# Patient Record
Sex: Female | Born: 1970 | Race: White | Hispanic: No | Marital: Married | Smoking: Never smoker
Health system: Southern US, Community
[De-identification: ages and names within clinical notes are randomized; demographics above are authoritative.]

## PROBLEM LIST (undated history)

## (undated) HISTORY — PX: AUGMENTATION MAMMAPLASTY: SUR837

---

## 1999-11-24 ENCOUNTER — Other Ambulatory Visit: Admission: RE | Admit: 1999-11-24 | Discharge: 1999-11-24 | Payer: Self-pay | Admitting: Family Medicine

## 2001-03-27 ENCOUNTER — Other Ambulatory Visit: Admission: RE | Admit: 2001-03-27 | Discharge: 2001-03-27 | Payer: Self-pay | Admitting: Obstetrics and Gynecology

## 2002-04-25 ENCOUNTER — Other Ambulatory Visit: Admission: RE | Admit: 2002-04-25 | Discharge: 2002-04-25 | Payer: Self-pay | Admitting: Obstetrics and Gynecology

## 2003-05-15 ENCOUNTER — Other Ambulatory Visit: Admission: RE | Admit: 2003-05-15 | Discharge: 2003-05-15 | Payer: Self-pay | Admitting: Obstetrics and Gynecology

## 2004-06-09 ENCOUNTER — Other Ambulatory Visit: Admission: RE | Admit: 2004-06-09 | Discharge: 2004-06-09 | Payer: Self-pay | Admitting: Obstetrics and Gynecology

## 2004-10-16 ENCOUNTER — Inpatient Hospital Stay (HOSPITAL_COMMUNITY): Admission: RE | Admit: 2004-10-16 | Discharge: 2004-10-20 | Payer: Self-pay | Admitting: Obstetrics and Gynecology

## 2004-10-21 ENCOUNTER — Encounter: Admission: RE | Admit: 2004-10-21 | Discharge: 2004-11-12 | Payer: Self-pay | Admitting: Obstetrics and Gynecology

## 2004-11-24 ENCOUNTER — Other Ambulatory Visit: Admission: RE | Admit: 2004-11-24 | Discharge: 2004-11-24 | Payer: Self-pay | Admitting: Obstetrics and Gynecology

## 2006-01-04 ENCOUNTER — Other Ambulatory Visit: Admission: RE | Admit: 2006-01-04 | Discharge: 2006-01-04 | Payer: Self-pay | Admitting: Obstetrics and Gynecology

## 2013-11-20 ENCOUNTER — Other Ambulatory Visit: Payer: Self-pay | Admitting: Obstetrics and Gynecology

## 2014-06-28 ENCOUNTER — Other Ambulatory Visit: Payer: Self-pay | Admitting: Obstetrics and Gynecology

## 2014-07-01 LAB — CYTOLOGY - PAP

## 2019-05-22 ENCOUNTER — Other Ambulatory Visit: Payer: Self-pay | Admitting: Obstetrics and Gynecology

## 2019-05-22 DIAGNOSIS — R928 Other abnormal and inconclusive findings on diagnostic imaging of breast: Secondary | ICD-10-CM

## 2019-05-25 ENCOUNTER — Other Ambulatory Visit: Payer: Self-pay

## 2019-05-25 ENCOUNTER — Ambulatory Visit
Admission: RE | Admit: 2019-05-25 | Discharge: 2019-05-25 | Disposition: A | Discharge status: Home / Self Care | Payer: BC Managed Care – PPO | Source: Ambulatory Visit | Attending: Obstetrics and Gynecology | Admitting: Obstetrics and Gynecology

## 2019-05-25 ENCOUNTER — Other Ambulatory Visit: Payer: Self-pay | Admitting: Obstetrics and Gynecology

## 2019-05-25 DIAGNOSIS — R928 Other abnormal and inconclusive findings on diagnostic imaging of breast: Secondary | ICD-10-CM

## 2019-07-03 ENCOUNTER — Other Ambulatory Visit: Payer: Self-pay

## 2019-07-03 DIAGNOSIS — Z20822 Contact with and (suspected) exposure to covid-19: Secondary | ICD-10-CM

## 2019-07-04 LAB — NOVEL CORONAVIRUS, NAA: SARS-CoV-2, NAA: NOT DETECTED

## 2019-11-23 ENCOUNTER — Ambulatory Visit
Admission: RE | Admit: 2019-11-23 | Discharge: 2019-11-23 | Disposition: A | Discharge status: Home / Self Care | Payer: BC Managed Care – PPO | Source: Ambulatory Visit | Attending: Obstetrics and Gynecology | Admitting: Obstetrics and Gynecology

## 2019-11-23 ENCOUNTER — Other Ambulatory Visit: Payer: Self-pay | Admitting: Obstetrics and Gynecology

## 2019-11-23 ENCOUNTER — Other Ambulatory Visit: Payer: Self-pay

## 2019-11-23 DIAGNOSIS — R921 Mammographic calcification found on diagnostic imaging of breast: Secondary | ICD-10-CM

## 2019-11-23 DIAGNOSIS — R928 Other abnormal and inconclusive findings on diagnostic imaging of breast: Secondary | ICD-10-CM

## 2020-05-30 ENCOUNTER — Ambulatory Visit
Admission: RE | Admit: 2020-05-30 | Discharge: 2020-05-30 | Disposition: A | Discharge status: Home / Self Care | Payer: BC Managed Care – PPO | Source: Ambulatory Visit | Attending: Obstetrics and Gynecology | Admitting: Obstetrics and Gynecology

## 2020-05-30 ENCOUNTER — Other Ambulatory Visit: Payer: Self-pay

## 2020-05-30 DIAGNOSIS — R921 Mammographic calcification found on diagnostic imaging of breast: Secondary | ICD-10-CM

## 2021-06-26 ENCOUNTER — Other Ambulatory Visit: Payer: Self-pay | Admitting: Obstetrics and Gynecology

## 2021-06-30 ENCOUNTER — Other Ambulatory Visit: Payer: Self-pay | Admitting: Obstetrics and Gynecology

## 2021-06-30 DIAGNOSIS — Z09 Encounter for follow-up examination after completed treatment for conditions other than malignant neoplasm: Secondary | ICD-10-CM

## 2021-06-30 DIAGNOSIS — R921 Mammographic calcification found on diagnostic imaging of breast: Secondary | ICD-10-CM

## 2021-07-23 ENCOUNTER — Ambulatory Visit
Admission: RE | Admit: 2021-07-23 | Discharge: 2021-07-23 | Disposition: A | Discharge status: Home / Self Care | Payer: BC Managed Care – PPO | Source: Ambulatory Visit | Attending: Obstetrics and Gynecology | Admitting: Obstetrics and Gynecology

## 2021-07-23 DIAGNOSIS — R921 Mammographic calcification found on diagnostic imaging of breast: Secondary | ICD-10-CM

## 2021-07-23 DIAGNOSIS — Z09 Encounter for follow-up examination after completed treatment for conditions other than malignant neoplasm: Secondary | ICD-10-CM

## 2022-03-23 ENCOUNTER — Emergency Department (HOSPITAL_COMMUNITY)
Admission: EM | Admit: 2022-03-23 | Discharge: 2022-03-23 | Discharge status: Against Medical Advice | Payer: BC Managed Care – PPO | Attending: Emergency Medicine | Admitting: Emergency Medicine

## 2022-03-23 ENCOUNTER — Other Ambulatory Visit: Payer: Self-pay

## 2022-03-23 ENCOUNTER — Encounter (HOSPITAL_COMMUNITY): Payer: Self-pay

## 2022-03-23 DIAGNOSIS — Z5321 Procedure and treatment not carried out due to patient leaving prior to being seen by health care provider: Secondary | ICD-10-CM | POA: Diagnosis not present

## 2022-03-23 DIAGNOSIS — R109 Unspecified abdominal pain: Secondary | ICD-10-CM | POA: Diagnosis not present

## 2022-03-23 DIAGNOSIS — R110 Nausea: Secondary | ICD-10-CM | POA: Insufficient documentation

## 2022-03-23 NOTE — ED Provider Triage Note (Signed)
Emergency Medicine Provider Triage Evaluation Note  Charlotte Morris , a 51 y.o. female  was evaluated in triage.  Pt complains of abd pain. Report pain below her umbilicus a few hrs ago.  Felt nauseous, tried to vomit without relief.  Initially thought she may have food poisoning, but now felt worse. No fever, cp sob, dysuria  Review of Systems  Positive: As above Negative: As above  Physical Exam  BP (!) 138/55 (BP Location: Right Arm)   Pulse (!) 57   Temp 97.8 F (36.6 C) (Oral)   Resp 17   Ht 5' 5.5" (1.664 m)   Wt 81.6 kg   LMP 05/14/2019   SpO2 97%   BMI 29.50 kg/m  Gen:   Awake, no distress   Resp:  Normal effort  MSK:   Moves extremities without difficulty  Other:    Medical Decision Making  Medically screening exam initiated at 9:05 PM.  Appropriate orders placed.  Charlotte Morris was informed that the remainder of the evaluation will be completed by another provider, this initial triage assessment does not replace that evaluation, and the importance of remaining in the ED until their evaluation is complete.     Fayrene Helper, PA-C 03/23/22 2106

## 2022-03-23 NOTE — ED Triage Notes (Signed)
Patient ate some expired meat. Made herself vomit. Having pain below her belly button. No diarrhea. Thinks it could be food poisoning.

## 2022-03-24 ENCOUNTER — Observation Stay (HOSPITAL_BASED_OUTPATIENT_CLINIC_OR_DEPARTMENT_OTHER)
Admission: EM | Admit: 2022-03-24 | Discharge: 2022-03-25 | Disposition: A | Discharge status: Home / Self Care | Payer: BC Managed Care – PPO | Attending: Surgery | Admitting: Surgery

## 2022-03-24 ENCOUNTER — Inpatient Hospital Stay (HOSPITAL_COMMUNITY): Payer: BC Managed Care – PPO | Admitting: Anesthesiology

## 2022-03-24 ENCOUNTER — Other Ambulatory Visit: Payer: Self-pay

## 2022-03-24 ENCOUNTER — Emergency Department (HOSPITAL_BASED_OUTPATIENT_CLINIC_OR_DEPARTMENT_OTHER): Payer: BC Managed Care – PPO

## 2022-03-24 ENCOUNTER — Encounter (HOSPITAL_COMMUNITY): Admission: EM | Disposition: A | Discharge status: Home / Self Care | Payer: Self-pay | Source: Home / Self Care

## 2022-03-24 ENCOUNTER — Encounter (HOSPITAL_BASED_OUTPATIENT_CLINIC_OR_DEPARTMENT_OTHER): Payer: Self-pay

## 2022-03-24 DIAGNOSIS — R109 Unspecified abdominal pain: Secondary | ICD-10-CM | POA: Diagnosis present

## 2022-03-24 DIAGNOSIS — K353 Acute appendicitis with localized peritonitis, without perforation or gangrene: Principal | ICD-10-CM

## 2022-03-24 DIAGNOSIS — K3531 Acute appendicitis with localized peritonitis and gangrene, without perforation: Principal | ICD-10-CM | POA: Insufficient documentation

## 2022-03-24 DIAGNOSIS — K3580 Unspecified acute appendicitis: Secondary | ICD-10-CM | POA: Diagnosis present

## 2022-03-24 HISTORY — PX: LAPAROSCOPIC APPENDECTOMY: SHX408

## 2022-03-24 LAB — CBC
HCT: 42.3 % (ref 36.0–46.0)
Hemoglobin: 14.5 g/dL (ref 12.0–15.0)
MCH: 30.8 pg (ref 26.0–34.0)
MCHC: 34.3 g/dL (ref 30.0–36.0)
MCV: 89.8 fL (ref 80.0–100.0)
Platelets: 275 10*3/uL (ref 150–400)
RBC: 4.71 MIL/uL (ref 3.87–5.11)
RDW: 12.8 % (ref 11.5–15.5)
WBC: 13.2 10*3/uL — ABNORMAL HIGH (ref 4.0–10.5)
nRBC: 0 % (ref 0.0–0.2)

## 2022-03-24 LAB — URINALYSIS, ROUTINE W REFLEX MICROSCOPIC
Bilirubin Urine: NEGATIVE
Glucose, UA: NEGATIVE mg/dL
Hgb urine dipstick: NEGATIVE
Ketones, ur: 15 mg/dL — AB
Nitrite: NEGATIVE
Specific Gravity, Urine: 1.034 — ABNORMAL HIGH (ref 1.005–1.030)
pH: 5.5 (ref 5.0–8.0)

## 2022-03-24 LAB — COMPREHENSIVE METABOLIC PANEL
ALT: 15 U/L (ref 0–44)
AST: 15 U/L (ref 15–41)
Albumin: 4.8 g/dL (ref 3.5–5.0)
Alkaline Phosphatase: 40 U/L (ref 38–126)
Anion gap: 12 (ref 5–15)
BUN: 15 mg/dL (ref 6–20)
CO2: 22 mmol/L (ref 22–32)
Calcium: 9.8 mg/dL (ref 8.9–10.3)
Chloride: 104 mmol/L (ref 98–111)
Creatinine, Ser: 0.76 mg/dL (ref 0.44–1.00)
GFR, Estimated: >60 mL/min (ref >60)
Glucose, Bld: 116 mg/dL — ABNORMAL HIGH (ref 70–99)
Potassium: 3.6 mmol/L (ref 3.5–5.1)
Sodium: 138 mmol/L (ref 135–145)
Total Bilirubin: 2 mg/dL — ABNORMAL HIGH (ref 0.3–1.2)
Total Protein: 7.6 g/dL (ref 6.5–8.1)

## 2022-03-24 LAB — LIPASE, BLOOD: Lipase: 31 U/L (ref 11–51)

## 2022-03-24 LAB — PREGNANCY, URINE: Preg Test, Ur: NEGATIVE

## 2022-03-24 SURGERY — APPENDECTOMY, LAPAROSCOPIC
Anesthesia: General | Site: Abdomen

## 2022-03-24 MED ORDER — MORPHINE SULFATE (PF) 4 MG/ML IV SOLN
6.0000 mg | Freq: Once | INTRAVENOUS | Status: AC
  Administered 2022-03-24: 6 mg via INTRAVENOUS
  Filled 2022-03-24: qty 2

## 2022-03-24 MED ORDER — DOCUSATE SODIUM 100 MG PO CAPS
200.0000 mg | ORAL_CAPSULE | Freq: Two times a day (BID) | ORAL | Status: DC
  Administered 2022-03-24 – 2022-03-25 (×2): 200 mg via ORAL
  Filled 2022-03-24 (×2): qty 2

## 2022-03-24 MED ORDER — 0.9 % SODIUM CHLORIDE (POUR BTL) OPTIME
TOPICAL | Status: DC | PRN
  Administered 2022-03-24: 1000 mL

## 2022-03-24 MED ORDER — METRONIDAZOLE 500 MG/100ML IV SOLN
500.0000 mg | Freq: Once | INTRAVENOUS | Status: AC
  Administered 2022-03-24: 500 mg via INTRAVENOUS
  Filled 2022-03-24: qty 100

## 2022-03-24 MED ORDER — LIDOCAINE HCL (CARDIAC) PF 100 MG/5ML IV SOSY
PREFILLED_SYRINGE | INTRAVENOUS | Status: DC | PRN
  Administered 2022-03-24: 60 mg via INTRAVENOUS

## 2022-03-24 MED ORDER — ONDANSETRON HCL 4 MG/2ML IJ SOLN
4.0000 mg | Freq: Four times a day (QID) | INTRAMUSCULAR | Status: DC | PRN
  Administered 2022-03-24: 4 mg via INTRAVENOUS
  Filled 2022-03-24: qty 2

## 2022-03-24 MED ORDER — LACTATED RINGERS IV SOLN
INTRAVENOUS | Status: DC
  Administered 2022-03-25: 900 mL via INTRAVENOUS

## 2022-03-24 MED ORDER — ROCURONIUM BROMIDE 100 MG/10ML IV SOLN
INTRAVENOUS | Status: DC | PRN
  Administered 2022-03-24: 50 mg via INTRAVENOUS

## 2022-03-24 MED ORDER — LIDOCAINE 2% (20 MG/ML) 5 ML SYRINGE
INTRAMUSCULAR | Status: AC
  Filled 2022-03-24: qty 10

## 2022-03-24 MED ORDER — TRAMADOL HCL 50 MG PO TABS
50.0000 mg | ORAL_TABLET | Freq: Four times a day (QID) | ORAL | Status: DC | PRN
  Administered 2022-03-25: 50 mg via ORAL
  Filled 2022-03-24: qty 1

## 2022-03-24 MED ORDER — ONDANSETRON HCL 4 MG/2ML IJ SOLN
INTRAMUSCULAR | Status: AC
  Filled 2022-03-24: qty 2

## 2022-03-24 MED ORDER — MIDAZOLAM HCL 2 MG/2ML IJ SOLN
INTRAMUSCULAR | Status: AC
  Filled 2022-03-24: qty 2

## 2022-03-24 MED ORDER — SODIUM CHLORIDE 0.9 % IR SOLN
Status: DC | PRN
  Administered 2022-03-24: 1000 mL

## 2022-03-24 MED ORDER — DIPHENHYDRAMINE HCL 12.5 MG/5ML PO ELIX: 12.5000 mg | ORAL_SOLUTION | Freq: Four times a day (QID) | ORAL | Status: DC | PRN

## 2022-03-24 MED ORDER — DEXAMETHASONE SODIUM PHOSPHATE 10 MG/ML IJ SOLN
INTRAMUSCULAR | Status: DC | PRN
  Administered 2022-03-24: 10 mg via INTRAVENOUS

## 2022-03-24 MED ORDER — ACETAMINOPHEN 10 MG/ML IV SOLN
INTRAVENOUS | Status: AC
  Filled 2022-03-24: qty 100

## 2022-03-24 MED ORDER — OXYCODONE HCL 5 MG/5ML PO SOLN: 5.0000 mg | Freq: Once | ORAL | Status: DC | PRN

## 2022-03-24 MED ORDER — SIMETHICONE 80 MG PO CHEW: 40.0000 mg | CHEWABLE_TABLET | Freq: Four times a day (QID) | ORAL | Status: DC | PRN

## 2022-03-24 MED ORDER — IOHEXOL 300 MG/ML  SOLN
100.0000 mL | Freq: Once | INTRAMUSCULAR | Status: AC | PRN
  Administered 2022-03-24: 80 mL via INTRAVENOUS

## 2022-03-24 MED ORDER — CHLORHEXIDINE GLUCONATE 0.12 % MT SOLN
OROMUCOSAL | Status: AC
  Filled 2022-03-24: qty 15

## 2022-03-24 MED ORDER — ONDANSETRON 4 MG PO TBDP: 4.0000 mg | ORAL_TABLET | Freq: Four times a day (QID) | ORAL | Status: DC | PRN

## 2022-03-24 MED ORDER — ACETAMINOPHEN 10 MG/ML IV SOLN
INTRAVENOUS | Status: DC | PRN
  Administered 2022-03-24: 1000 mg via INTRAVENOUS

## 2022-03-24 MED ORDER — SUGAMMADEX SODIUM 200 MG/2ML IV SOLN
INTRAVENOUS | Status: DC | PRN
  Administered 2022-03-24: 200 mg via INTRAVENOUS

## 2022-03-24 MED ORDER — ROCURONIUM BROMIDE 10 MG/ML (PF) SYRINGE
PREFILLED_SYRINGE | INTRAVENOUS | Status: AC
  Filled 2022-03-24: qty 30

## 2022-03-24 MED ORDER — ACETAMINOPHEN 500 MG PO TABS
1000.0000 mg | ORAL_TABLET | Freq: Four times a day (QID) | ORAL | Status: DC
  Administered 2022-03-25: 1000 mg via ORAL
  Filled 2022-03-24: qty 2

## 2022-03-24 MED ORDER — SODIUM CHLORIDE 0.9 % IV SOLN
2.0000 g | Freq: Once | INTRAVENOUS | Status: AC
  Administered 2022-03-24: 2 g via INTRAVENOUS
  Filled 2022-03-24: qty 20

## 2022-03-24 MED ORDER — ONDANSETRON HCL 4 MG/2ML IJ SOLN: 4.0000 mg | Freq: Four times a day (QID) | INTRAMUSCULAR | Status: DC | PRN

## 2022-03-24 MED ORDER — OXYCODONE HCL 5 MG PO TABS: 5.0000 mg | ORAL_TABLET | Freq: Once | ORAL | Status: DC | PRN

## 2022-03-24 MED ORDER — FENTANYL CITRATE (PF) 250 MCG/5ML IJ SOLN
INTRAMUSCULAR | Status: AC
  Filled 2022-03-24: qty 5

## 2022-03-24 MED ORDER — ENOXAPARIN SODIUM 30 MG/0.3ML IJ SOSY
30.0000 mg | PREFILLED_SYRINGE | Freq: Two times a day (BID) | INTRAMUSCULAR | Status: DC
  Administered 2022-03-25: 30 mg via SUBCUTANEOUS
  Filled 2022-03-24: qty 0.3

## 2022-03-24 MED ORDER — HYDRALAZINE HCL 20 MG/ML IJ SOLN: 10.0000 mg | INTRAMUSCULAR | Status: DC | PRN

## 2022-03-24 MED ORDER — TRAMADOL HCL 50 MG PO TABS: 50.0000 mg | ORAL_TABLET | Freq: Four times a day (QID) | ORAL | Status: DC | PRN

## 2022-03-24 MED ORDER — FENTANYL CITRATE (PF) 100 MCG/2ML IJ SOLN
INTRAMUSCULAR | Status: DC | PRN
  Administered 2022-03-24 (×2): 50 ug via INTRAVENOUS

## 2022-03-24 MED ORDER — PROPOFOL 10 MG/ML IV BOLUS
INTRAVENOUS | Status: DC | PRN
  Administered 2022-03-24: 170 mg via INTRAVENOUS

## 2022-03-24 MED ORDER — ONDANSETRON HCL 4 MG/2ML IJ SOLN: 4.0000 mg | Freq: Once | INTRAMUSCULAR | Status: DC | PRN

## 2022-03-24 MED ORDER — KETOROLAC TROMETHAMINE 30 MG/ML IJ SOLN
INTRAMUSCULAR | Status: DC | PRN
  Administered 2022-03-24: 30 mg via INTRAVENOUS

## 2022-03-24 MED ORDER — SUCCINYLCHOLINE CHLORIDE 200 MG/10ML IV SOSY
PREFILLED_SYRINGE | INTRAVENOUS | Status: AC
  Filled 2022-03-24: qty 10

## 2022-03-24 MED ORDER — PHENYLEPHRINE 80 MCG/ML (10ML) SYRINGE FOR IV PUSH (FOR BLOOD PRESSURE SUPPORT)
PREFILLED_SYRINGE | INTRAVENOUS | Status: AC
  Filled 2022-03-24: qty 20

## 2022-03-24 MED ORDER — KETOROLAC TROMETHAMINE 30 MG/ML IJ SOLN
INTRAMUSCULAR | Status: AC
  Filled 2022-03-24: qty 1

## 2022-03-24 MED ORDER — IBUPROFEN 600 MG PO TABS: 600.0000 mg | ORAL_TABLET | Freq: Four times a day (QID) | ORAL | Status: DC | PRN

## 2022-03-24 MED ORDER — PHENYLEPHRINE HCL (PRESSORS) 10 MG/ML IV SOLN
INTRAVENOUS | Status: DC | PRN
  Administered 2022-03-24 (×5): 80 ug via INTRAVENOUS

## 2022-03-24 MED ORDER — FENTANYL CITRATE (PF) 100 MCG/2ML IJ SOLN
INTRAMUSCULAR | Status: AC
  Filled 2022-03-24: qty 2

## 2022-03-24 MED ORDER — LACTATED RINGERS IV BOLUS
1000.0000 mL | Freq: Once | INTRAVENOUS | Status: AC
  Administered 2022-03-24: 1000 mL via INTRAVENOUS

## 2022-03-24 MED ORDER — FENTANYL CITRATE (PF) 100 MCG/2ML IJ SOLN
25.0000 ug | INTRAMUSCULAR | Status: DC | PRN
  Administered 2022-03-24: 25 ug via INTRAVENOUS

## 2022-03-24 MED ORDER — HYDROMORPHONE HCL 1 MG/ML IJ SOLN: 0.5000 mg | INTRAMUSCULAR | Status: DC | PRN

## 2022-03-24 MED ORDER — ONDANSETRON HCL 4 MG/2ML IJ SOLN
INTRAMUSCULAR | Status: DC | PRN
  Administered 2022-03-24: 4 mg via INTRAVENOUS

## 2022-03-24 MED ORDER — BUPIVACAINE-EPINEPHRINE 0.25% -1:200000 IJ SOLN
INTRAMUSCULAR | Status: DC | PRN
  Administered 2022-03-24: 17 mL

## 2022-03-24 MED ORDER — MIDAZOLAM HCL 5 MG/5ML IJ SOLN
INTRAMUSCULAR | Status: DC | PRN
  Administered 2022-03-24: 2 mg via INTRAVENOUS

## 2022-03-24 MED ORDER — FENTANYL CITRATE (PF) 100 MCG/2ML IJ SOLN
50.0000 ug | Freq: Once | INTRAMUSCULAR | Status: AC
  Administered 2022-03-24: 50 ug via INTRAVENOUS

## 2022-03-24 MED ORDER — LACTATED RINGERS IV SOLN: INTRAVENOUS | Status: DC

## 2022-03-24 MED ORDER — CHLORHEXIDINE GLUCONATE 4 % EX LIQD: 1.0000 | Freq: Once | CUTANEOUS | Status: DC

## 2022-03-24 MED ORDER — BUPIVACAINE-EPINEPHRINE (PF) 0.25% -1:200000 IJ SOLN
INTRAMUSCULAR | Status: AC
  Filled 2022-03-24: qty 30

## 2022-03-24 MED ORDER — SUCCINYLCHOLINE CHLORIDE 200 MG/10ML IV SOSY
PREFILLED_SYRINGE | INTRAVENOUS | Status: DC | PRN
  Administered 2022-03-24: 120 mg via INTRAVENOUS

## 2022-03-24 MED ORDER — DIPHENHYDRAMINE HCL 50 MG/ML IJ SOLN: 12.5000 mg | Freq: Four times a day (QID) | INTRAMUSCULAR | Status: DC | PRN

## 2022-03-24 SURGICAL SUPPLY — 42 items
APPLIER CLIP 5 13 M/L LIGAMAX5 (MISCELLANEOUS)
BAG COUNTER SPONGE SURGICOUNT (BAG) ×2 IMPLANT
BLADE CLIPPER SURG (BLADE) IMPLANT
CANISTER SUCT 3000ML PPV (MISCELLANEOUS) ×2 IMPLANT
CHLORAPREP W/TINT 26 (MISCELLANEOUS) ×2 IMPLANT
CLIP APPLIE 5 13 M/L LIGAMAX5 (MISCELLANEOUS) IMPLANT
COVER SURGICAL LIGHT HANDLE (MISCELLANEOUS) ×2 IMPLANT
CUTTER FLEX LINEAR 45M (STAPLE) ×2 IMPLANT
DERMABOND ADVANCED (GAUZE/BANDAGES/DRESSINGS) ×1
DERMABOND ADVANCED .7 DNX12 (GAUZE/BANDAGES/DRESSINGS) ×1 IMPLANT
ELECT REM PT RETURN 9FT ADLT (ELECTROSURGICAL) ×2
ELECTRODE REM PT RTRN 9FT ADLT (ELECTROSURGICAL) ×1 IMPLANT
GLOVE BIO SURGEON STRL SZ7.5 (GLOVE) ×2 IMPLANT
GLOVE INDICATOR 8.0 STRL GRN (GLOVE) ×2 IMPLANT
GOWN STRL REUS W/ TWL LRG LVL3 (GOWN DISPOSABLE) ×2 IMPLANT
GOWN STRL REUS W/ TWL XL LVL3 (GOWN DISPOSABLE) ×1 IMPLANT
GOWN STRL REUS W/TWL LRG LVL3 (GOWN DISPOSABLE) ×2
GOWN STRL REUS W/TWL XL LVL3 (GOWN DISPOSABLE) ×2
KIT BASIN OR (CUSTOM PROCEDURE TRAY) ×2 IMPLANT
KIT TURNOVER KIT B (KITS) ×2 IMPLANT
NS IRRIG 1000ML POUR BTL (IV SOLUTION) ×2 IMPLANT
PAD ARMBOARD 7.5X6 YLW CONV (MISCELLANEOUS) ×4 IMPLANT
PENCIL SMOKE EVACUATOR (MISCELLANEOUS) ×2 IMPLANT
POUCH SPECIMEN RETRIEVAL 10MM (ENDOMECHANICALS) ×2 IMPLANT
RELOAD 45 VASCULAR/THIN (ENDOMECHANICALS) IMPLANT
RELOAD STAPLE 45 2.5 WHT GRN (ENDOMECHANICALS) IMPLANT
RELOAD STAPLE 45 3.5 BLU ETS (ENDOMECHANICALS) IMPLANT
RELOAD STAPLE TA45 3.5 REG BLU (ENDOMECHANICALS) ×2 IMPLANT
SCISSORS LAP 5X35 DISP (ENDOMECHANICALS) ×1 IMPLANT
SET IRRIG TUBING LAPAROSCOPIC (IRRIGATION / IRRIGATOR) ×2 IMPLANT
SET TUBE SMOKE EVAC HIGH FLOW (TUBING) ×2 IMPLANT
SHEARS HARMONIC ACE PLUS 36CM (ENDOMECHANICALS) ×2 IMPLANT
SPECIMEN JAR SMALL (MISCELLANEOUS) ×2 IMPLANT
SUT MNCRL AB 4-0 PS2 18 (SUTURE) ×2 IMPLANT
TOWEL GREEN STERILE (TOWEL DISPOSABLE) ×2 IMPLANT
TOWEL GREEN STERILE FF (TOWEL DISPOSABLE) ×2 IMPLANT
TRAY FOLEY W/BAG SLVR 16FR (SET/KITS/TRAYS/PACK) ×2
TRAY FOLEY W/BAG SLVR 16FR ST (SET/KITS/TRAYS/PACK) ×1 IMPLANT
TRAY LAPAROSCOPIC MC (CUSTOM PROCEDURE TRAY) ×2 IMPLANT
TROCAR ADV FIXATION 5X100MM (TROCAR) ×4 IMPLANT
TROCAR XCEL BLUNT TIP 100MML (ENDOMECHANICALS) ×2 IMPLANT
WATER STERILE IRR 1000ML POUR (IV SOLUTION) ×2 IMPLANT

## 2022-03-24 NOTE — ED Provider Notes (Addendum)
[facility] EMERGENCY DEPT Provider Note   CSN: [id number] Arrival date & time: 03/24/22  1133     History  Chief Complaint  Patient presents with  . Nausea  . Emesis    Charlotte Morris is a 51 y.o. female.  HPI     51 year old female comes in with chief complaint of nausea, vomiting and abdominal pain.  Patient describing abdominal discomfort yesterday.  She thought that the pain could be because she ate wrong type of meat.  However, over time her pain has gotten worse.  The pain is fairly constant, worse with walking and with cough and laughing.  She denies any associated burning with urination, blood in the urine.  Patient denies any nausea besides when she has excruciating pain.  Review of systems negative for any fevers, chills.  She has no history of pelvic disorder.  Denies any vaginal bleeding, discharge, constipation or bloody stool.  Home Medications Prior to Admission medications   Not on File      Allergies    Patient has no known allergies.    Review of Systems   Review of Systems  All other systems reviewed and are negative.   Physical Exam Updated Vital Signs BP 126/78   Pulse 73   Temp 98.1 F (36.7 C)   Resp 17   LMP 05/14/2019   SpO2 97%  Physical Exam Vitals and nursing note reviewed.  Constitutional:      Appearance: She is well-developed.  HENT:     Head: Atraumatic.  Cardiovascular:     Rate and Rhythm: Normal rate.  Pulmonary:     Effort: Pulmonary effort is normal.  Abdominal:     Tenderness: There is abdominal tenderness. There is guarding. There is no rebound.     Comments:  + McBurney, positive Rovsing  Musculoskeletal:     Cervical back: Normal range of motion and neck supple.  Skin:    General: Skin is warm and dry.  Neurological:     Mental Status: She is alert and oriented to person, place, and time.     ED Results / Procedures / Treatments   Labs (all labs ordered are listed, but only abnormal  results are displayed) Labs Reviewed  COMPREHENSIVE METABOLIC PANEL - Abnormal; Notable for the following components:      Result Value   Glucose, Bld 116 (*)    Total Bilirubin 2.0 (*)    All other components within normal limits  CBC - Abnormal; Notable for the following components:   WBC 13.2 (*)    All other components within normal limits  URINALYSIS, ROUTINE W REFLEX MICROSCOPIC - Abnormal; Notable for the following components:   Specific Gravity, Urine 1.034 (*)    Ketones, ur 15 (*)    Protein, ur TRACE (*)    Leukocytes,Ua TRACE (*)    All other components within normal limits  LIPASE, BLOOD  PREGNANCY, URINE    EKG None  Radiology CT ABDOMEN PELVIS W CONTRAST  Result Date: 03/24/2022 CLINICAL DATA:  Right lower quadrant abdominal pain EXAM: CT ABDOMEN AND PELVIS WITH CONTRAST TECHNIQUE: Multidetector CT imaging of the abdomen and pelvis was performed using the standard protocol following bolus administration of intravenous contrast. RADIATION DOSE REDUCTION: This exam was performed according to the departmental dose-optimization program which includes automated exposure control, adjustment of the mA and/or kV according to patient size and/or use of iterative reconstruction technique. CONTRAST:  68mL OMNIPAQUE IOHEXOL 300 MG/ML  SOLN COMPARISON:  None  Available. FINDINGS: Lower chest: No acute abnormality. Hepatobiliary: Small low-attenuation lesion of the right hepatic dome which is likely a simple hepatic cysts. No suspicious liver lesions. Gallbladder is unremarkable. No biliary ductal dilation. Pancreas: Unremarkable. No pancreatic ductal dilatation or surrounding inflammatory changes. Spleen: Normal in size without focal abnormality. Adrenals/Urinary Tract: Adrenal glands are unremarkable. No hydronephrosis or nephrolithiasis. Low-attenuation lesions of the right kidney which are too small to completely characterize but likely simple cysts, no further follow-up imaging is  needed. Bladder is unremarkable. Stomach/Bowel: Dilated appendix, measuring up to 14 mm, with wall thickening and surrounding inflammatory change. No evidence of extraluminal air or fluid collection. Normal appearing stomach. Small and large bowel with no wall thickening, inflammatory change or evidence of obstruction. Vascular/Lymphatic: No significant vascular findings are present. No enlarged abdominal or pelvic lymph nodes. Reproductive: Uterus and bilateral adnexa are unremarkable. Other: Bilateral breast implants.  No free intraperitoneal fluid. Musculoskeletal: No acute or significant osseous findings. IMPRESSION: Findings compatible with acute uncomplicated appendicitis. Electronically Signed   By: Yetta Glassman M.D.   On: 03/24/2022 16:24    Procedures Procedures    Medications Ordered in ED Medications  ondansetron (ZOFRAN) injection 4 mg (4 mg Intravenous Given 03/24/22 1542)  cefTRIAXone (ROCEPHIN) 2 g in sodium chloride 0.9 % 100 mL IVPB (2 g Intravenous New Bag/Given 03/24/22 1718)    And  metroNIDAZOLE (FLAGYL) IVPB 500 mg (has no administration in time range)  lactated ringers infusion (has no administration in time range)  lactated ringers bolus 1,000 mL (0 mLs Intravenous Stopped 03/24/22 1704)  morphine (PF) 4 MG/ML injection 6 mg (6 mg Intravenous Given 03/24/22 1543)  iohexol (OMNIPAQUE) 300 MG/ML solution 100 mL (80 mLs Intravenous Contrast Given 03/24/22 1556)    ED Course/ Medical Decision Making/ A&P Clinical Course as of 03/24/22 1721  Wed Mar 24, 2022  1721 Dr. Michaelle Birks, general surgery will accept the patient.  She is requesting that the patient be transferred to [facility] preop area. [AN]    Clinical Course User Index [AN] Varney Biles, MD                           Medical Decision Making Amount and/or Complexity of Data Reviewed Labs: ordered. Radiology: ordered.  Risk Prescription drug management. Decision regarding hospitalization.   This  patient presents to the ED with chief complaint(s) of abdominal pain. The complaint involves an extensive differential diagnosis and also carries with it a high risk of complications and morbidity.    The differential diagnosis includes on exam patient clearly has McBurney's and positive Rovsing sign.  Differential diagnosis essentially includes acute appendicitis and possible complication from it such as perforation or contained abscess.  Other possibilities considered include atypical presentation of diverticulitis or cholecystitis.  There does not appear to be any GU symptoms.  Ovarian cyst and torsion also considered.  The initial plan is to get basic labs, CT abdomen and pelvis.  Additional history obtained: Additional history obtained from spouse  Independent labs interpretation:  The following labs were independently interpreted: Slightly elevated white count  Independent visualization of imaging: - I independently visualized the following imaging with scope of interpretation limited to determining acute life threatening conditions related to emergency care: CT abdomen and pelvis, which revealed evidence of acute appendicitis, no clear evidence of perforation.  Treatment and Reassessment: Patient reassessed.  She has received IV morphine.  We will order IV antibiotics.  I consulted and  spoke with general surgery, they will admit the patient. Awaiting callback from them to decide on transfer.  Final Clinical Impression(s) / ED Diagnoses Final diagnoses:  Acute appendicitis with localized peritonitis, without perforation, abscess, or gangrene    Rx / DC Orders ED Discharge Orders     None         Derwood Kaplan, MD 03/24/22 1721

## 2022-03-24 NOTE — ED Triage Notes (Signed)
Pt presents POV for ongoing N/V since yesterday. She ate expired meat yesterday and then became nauseous, made herself vomit. Pt denies uncontrollable emesis, states, "I have to make myself vomit" pt also denies diarrhea.  Went to [facility] ED last night but left due to wait time. Woke today not feeling any better, now having RLQ pain.

## 2022-03-24 NOTE — Anesthesia Procedure Notes (Signed)
Procedure Name: Intubation Date/Time: 03/24/2022 8:17 PM  Performed by: Anoushka Divito T, CRNAPre-anesthesia Checklist: Patient identified, Emergency Drugs available, Suction available and Patient being monitored Patient Re-evaluated:Patient Re-evaluated prior to induction Oxygen Delivery Method: Circle system utilized Preoxygenation: Pre-oxygenation with 100% oxygen Induction Type: IV induction, Rapid sequence and Cricoid Pressure applied Ventilation: Mask ventilation without difficulty Laryngoscope Size: Mac and 4 Grade View: Grade I Tube type: Oral Tube size: 7.5 mm Number of attempts: 1 Airway Equipment and Method: Stylet and Oral airway Placement Confirmation: ETT inserted through vocal cords under direct vision, positive ETCO2 and breath sounds checked- equal and bilateral Secured at: 22 cm Tube secured with: Tape Dental Injury: Teeth and Oropharynx as per pre-operative assessment

## 2022-03-24 NOTE — Op Note (Signed)
Charlotte Morris [id number]   PRE-OPERATIVE DIAGNOSIS:  Acute appendicitis  POST-OPERATIVE DIAGNOSIS:  Acute suppurative appendicitis without evident perforation or abscess  PROCEDURE: Laparoscopic appendectomy  SURGEON:  Stephanie Coup. Jahmeir Geisen, M.D.  ASSISTANT: OR staff  ANESTHESIA: General endotracheal  EBL:   5 mL  DRAINS: None  SPECIMEN:  Appendix  COUNTS:  Sponge, needle and instrument counts were reported correct x2 at conclusion of the operation  DISPOSITION:  PACU in satisfactory condition  COMPLICATIONS: None  FINDINGS: Acutely inflamed suppurative appendix on the distal half of it consistent with appendicitis.  Proximal half of the appendix is more normal in caliber and without nearly as significant inflammatory changes.  Fairly redundant cecum with a retrocecal appendix but due to the mobility of her cecum, this is still within the "abdominal cavity."  Cecum and ascending colon as well as terminal ileum are normal in appearance.  Appendectomy carried out uneventfully.  DESCRIPTION:   The patient was identified & brought into the operating room. SCDs were in place and functioning. General endotracheal anesthesia was administered. Preoperative antibiotics were administered. The patient was positioned supine with left arm tucked. Hair on the abdomen was then clipped by the OR team. A foley catheter was inserted under sterile conditions. The abdomen was prepped and draped in the standard sterile fashion. A surgical timeout confirmed our plan.  A small incision was made in the infraumbilical skin. The subcutaneous tissue was dissected and the umbilical stalk identified. The stalk was grasped with a Kocher and retracted outwardly. The infraumbilical fascia was exposed and incised. Peritoneal entry was carefully made bluntly. A 0 Vicryl purse-string suture was placed and then the [facility] port was introduced into the abdomen.  CO2 insufflation commenced to . The  laparoscope was inserted and confirmed no evidence of trocar site complications. The patient was then positioned in Trendelenburg. Two additional ports were placed - one in left lower quadrant and another in the suprapubic midline taking care to stay well above the bladder - 4 fingerbreadths above the pubic symphysis. The bed was then slightly tilted to place the left side down.  The appendix is readily identified in the right lower quadrant where it is somewhat adherent to the wall.  She has a fairly mobile cecum and proximal ascending colon.  Her appendix technically occupies a retrocecal position within the cecal mesentery but this is within the abdominal cavity due to the mobility of her cecum.  The distal half of the appendix is severely inflamed and suppurative, tense, clinically consistent with appendicitis.  Interestingly, the proximal half of her appendix tapers to normal caliber and is not nearly as inflamed in appearance.  The terminal ileum, cecum, and ascending colon are all healthy and normal in appearance.  We began by incising the peritoneum on the medial side of her appendix delivering it from its retrocecal position.  The base of the appendix which is healthy in appearance is circumferentially cleared by creating a window in the appendiceal mesentery.  A laparoscopic linear cutting stapler was then utilized to clamp and divide the appendix using a blue load.  The staple line is inspected and noted to be intact and hemostatic.  We then divided the appendiceal mesentery taking care to stay close to the appendix and away from her colon using the harmonic scalpel.  The appendiceal mesentery is inspected and noted to be hemostatic.  A laparoscopic retrieval bag is inserted through the umbilical trocar site and the appendix is placed in this bag.  This  is then removed from the abdomen and passed off the specimen.  The right lower quadrant was conservatively irrigated.  Hemostasis is appreciated.   The staple line on the cecum was reinspected and noted to be intact.  The right lower quadrant appeared clean and as such, no drain was placed.  The left lower quadrant and suprapubic ports were removed under direct visualization and hemostatic.  The CO2 was exhausted from the abdomen. The umbilical fascia was then closed by closing the 0 Vicryl suture. The fascia was palpated and noted to be completely closed. The skin of all port sites was then approximated using 4-0 Monocryl suture. The incisions were covered with Dermabond.  She was then awakened from general anesthesia, extubated, and transferred to a stretcher for transport to recovery in satisfactory condition.

## 2022-03-24 NOTE — Transfer of Care (Signed)
Immediate Anesthesia Transfer of Care Note  Patient: Charlotte Morris  Procedure(s) Performed: APPENDECTOMY LAPAROSCOPIC (Abdomen)  Patient Location: PACU  Anesthesia Type:General  Level of Consciousness: awake, alert  and oriented  Airway & Oxygen Therapy: Patient Spontanous Breathing and Patient connected to nasal cannula oxygen  Post-op Assessment: Report given to RN and Post -op Vital signs reviewed and stable  Post vital signs: Reviewed and stable  Last Vitals:  Vitals Value Taken Time  BP 115/50 03/24/22 2138  Temp 37.2 C 03/24/22 2138  Pulse 82 03/24/22 2142  Resp 21 03/24/22 2142  SpO2 94 % 03/24/22 2142  Vitals shown include unvalidated device data.  Last Pain:  Vitals:   03/24/22 1852  TempSrc:   PainSc: 7          Complications: No notable events documented.

## 2022-03-24 NOTE — Anesthesia Preprocedure Evaluation (Addendum)
Anesthesia Evaluation  Patient identified by MRN, date of birth, ID band Patient awake    Reviewed: Allergy & Precautions, NPO status , Patient's Chart, lab work & pertinent test results  History of Anesthesia Complications Negative for: history of anesthetic complications  Airway Mallampati: II  TM Distance: >3 FB Neck ROM: Full    Dental  (+) Dental Advisory Given, Teeth Intact   Pulmonary neg pulmonary ROS,    Pulmonary exam normal        Cardiovascular negative cardio ROS Normal cardiovascular exam     Neuro/Psych negative neurological ROS  negative psych ROS   GI/Hepatic Neg liver ROS,  Appendicitis    Endo/Other  negative endocrine ROS  Renal/GU negative Renal ROS     Musculoskeletal negative musculoskeletal ROS (+)   Abdominal   Peds  Hematology negative hematology ROS (+)   Anesthesia Other Findings   Reproductive/Obstetrics                            Anesthesia Physical Anesthesia Plan  ASA: 1 and emergent  Anesthesia Plan: General   Post-op Pain Management: Ofirmev IV (intra-op)* and Toradol IV (intra-op)*   Induction: Intravenous and Rapid sequence  PONV Risk Score and Plan: 3 and Treatment may vary due to age or medical condition, Ondansetron, Dexamethasone and Midazolam  Airway Management Planned: Oral ETT  Additional Equipment: None  Intra-op Plan:   Post-operative Plan: Extubation in OR  Informed Consent: I have reviewed the patients History and Physical, chart, labs and discussed the procedure including the risks, benefits and alternatives for the proposed anesthesia with the patient or authorized representative who has indicated his/her understanding and acceptance.     Dental advisory given  Plan Discussed with: CRNA and Anesthesiologist  Anesthesia Plan Comments:        Anesthesia Quick Evaluation

## 2022-03-24 NOTE — ED Notes (Signed)
Handoff report given to Junious Dresser at pre op at [facility]

## 2022-03-24 NOTE — Anesthesia Postprocedure Evaluation (Signed)
Anesthesia Post Note  Patient: Charlotte Morris  Procedure(s) Performed: APPENDECTOMY LAPAROSCOPIC (Abdomen)     Patient location during evaluation: PACU Anesthesia Type: General Level of consciousness: awake and alert Pain management: pain level controlled Vital Signs Assessment: post-procedure vital signs reviewed and stable Respiratory status: spontaneous breathing, nonlabored ventilation and respiratory function stable Cardiovascular status: stable and blood pressure returned to baseline Anesthetic complications: no   No notable events documented.  Last Vitals:  Vitals:   03/24/22 2225 03/24/22 2243  BP:  (!) 102/59  Pulse: 83 70  Resp: (!) 24 18  Temp: 37.1 C 36.9 C  SpO2: 92% 93%    Last Pain:  Vitals:   03/24/22 2243  TempSrc: Oral  PainSc:                  Beryle Lathe

## 2022-03-24 NOTE — H&P (Addendum)
CC: Right lower quadrant pain  HPI: Cori Alexie is an 51 y.o. female with hx of RLQ pain that began yesterday. Initially was concerned it was some type of food poisoning. Pain worsened and she presented to [place] for further eval. No radiation. She notes pain in her RLQ when she presses on her LLQ. No aggrav/allev factors. Never had this kind of pain before. Reports she had a colonoscopy in [place] in the last year that was reportedly normal. Denies any blood in her stool or recent weight changes.  Her husband is at bedside  History reviewed. No pertinent past medical history.  Past Surgical History:  Procedure Laterality Date   AUGMENTATION MAMMAPLASTY Bilateral   Abdominoplasty  History reviewed. No pertinent family history.  Social:  reports that she has never smoked. She has never used smokeless tobacco. She reports current alcohol use. She reports that she does not use drugs.  Allergies: No Known Allergies  Medications: I have reviewed the patient's current medications.  Results for orders placed or performed during the hospital encounter of 03/24/22 (from the past 48 hour(s))  Pregnancy, urine     Status: None   Collection Time: 03/24/22 12:20 PM  Result Value Ref Range   Preg Test, Ur NEGATIVE NEGATIVE    Comment:        THE SENSITIVITY OF THIS METHODOLOGY IS >20 mIU/mL. Performed at [facility], [address], [place], [place] [address]   Lipase, blood     Status: None   Collection Time: 03/24/22 12:21 PM  Result Value Ref Range   Lipase 31 11 - 51 U/L    Comment: Performed at [facility], [address] [place], [place] [address]  Comprehensive metabolic panel     Status: Abnormal   Collection Time: 03/24/22 12:21 PM  Result Value Ref Range   Sodium 138 135 - 145 mmol/L   Potassium 3.6 3.5 - 5.1 mmol/L   Chloride 104 98 - 111 mmol/L   CO2 22 22 - 32 mmol/L   Glucose, Bld 116 (H) 70 - 99 mg/dL     Comment: Glucose reference range applies only to samples taken after fasting for at least 8 hours.   BUN 15 6 - 20 mg/dL   Creatinine, Ser 0.76 0.44 - 1.00 mg/dL   Calcium 9.8 8.9 - 10.3 mg/dL   Total Protein 7.6 6.5 - 8.1 g/dL   Albumin 4.8 3.5 - 5.0 g/dL   AST 15 15 - 41 U/L   ALT 15 0 - 44 U/L   Alkaline Phosphatase 40 38 - 126 U/L   Total Bilirubin 2.0 (H) 0.3 - 1.2 mg/dL   GFR, Estimated >60 >60 mL/min    Comment: (NOTE) Calculated using the CKD-EPI Creatinine Equation (2021)    Anion gap 12 5 - 15    Comment: Performed at [facility], [address], [place], [place] [address]  CBC     Status: Abnormal   Collection Time: 03/24/22 12:21 PM  Result Value Ref Range   WBC 13.2 (H) 4.0 - 10.5 K/uL   RBC 4.71 3.87 - 5.11 MIL/uL   Hemoglobin 14.5 12.0 - 15.0 g/dL   HCT 42.3 36.0 - 46.0 %   MCV 89.8 80.0 - 100.0 fL   MCH 30.8 26.0 - 34.0 pg   MCHC 34.3 30.0 - 36.0 g/dL   RDW 12.8 11.5 - 15.5 %   Platelets 275 150 - 400 K/uL   nRBC 0.0 0.0 -  0.2 %    Comment: Performed at [facility], [address] [place], [place] [address]  Urinalysis, Routine w reflex microscopic Urine, Clean Catch     Status: Abnormal   Collection Time: 03/24/22 12:21 PM  Result Value Ref Range   Color, Urine YELLOW YELLOW   APPearance CLEAR CLEAR   Specific Gravity, Urine 1.034 (H) 1.005 - 1.030   pH 5.5 5.0 - 8.0   Glucose, UA NEGATIVE NEGATIVE mg/dL   Hgb urine dipstick NEGATIVE NEGATIVE   Bilirubin Urine NEGATIVE NEGATIVE   Ketones, ur 15 (A) NEGATIVE mg/dL   Protein, ur TRACE (A) NEGATIVE mg/dL   Nitrite NEGATIVE NEGATIVE   Leukocytes,Ua TRACE (A) NEGATIVE   RBC / HPF 0-5 0 - 5 RBC/hpf   WBC, UA 0-5 0 - 5 WBC/hpf   Squamous Epithelial / LPF 0-5 0 - 5   Mucus PRESENT     Comment: Performed at [facility], [address], [place], [place] [address]    CT ABDOMEN PELVIS W CONTRAST  Result Date: 03/24/2022 CLINICAL DATA:  Right  lower quadrant abdominal pain EXAM: CT ABDOMEN AND PELVIS WITH CONTRAST TECHNIQUE: Multidetector CT imaging of the abdomen and pelvis was performed using the standard protocol following bolus administration of intravenous contrast. RADIATION DOSE REDUCTION: This exam was performed according to the departmental dose-optimization program which includes automated exposure control, adjustment of the mA and/or kV according to patient size and/or use of iterative reconstruction technique. CONTRAST:  55mL OMNIPAQUE IOHEXOL 300 MG/ML  SOLN COMPARISON:  None Available. FINDINGS: Lower chest: No acute abnormality. Hepatobiliary: Small low-attenuation lesion of the right hepatic dome which is likely a simple hepatic cysts. No suspicious liver lesions. Gallbladder is unremarkable. No biliary ductal dilation. Pancreas: Unremarkable. No pancreatic ductal dilatation or surrounding inflammatory changes. Spleen: Normal in size without focal abnormality. Adrenals/Urinary Tract: Adrenal glands are unremarkable. No hydronephrosis or nephrolithiasis. Low-attenuation lesions of the right kidney which are too small to completely characterize but likely simple cysts, no further follow-up imaging is needed. Bladder is unremarkable. Stomach/Bowel: Dilated appendix, measuring up to 14 mm, with wall thickening and surrounding inflammatory change. No evidence of extraluminal air or fluid collection. Normal appearing stomach. Small and large bowel with no wall thickening, inflammatory change or evidence of obstruction. Vascular/Lymphatic: No significant vascular findings are present. No enlarged abdominal or pelvic lymph nodes. Reproductive: Uterus and bilateral adnexa are unremarkable. Other: Bilateral breast implants.  No free intraperitoneal fluid. Musculoskeletal: No acute or significant osseous findings. IMPRESSION: Findings compatible with acute uncomplicated appendicitis. Electronically Signed   By: Allegra Lai M.D.   On: 03/24/2022  16:24    ROS - all of the below systems have been reviewed with the patient and positives are indicated with bold text General: chills, fever or night sweats Eyes: blurry vision or double vision ENT: epistaxis or sore throat Allergy/Immunology: itchy/watery eyes or nasal congestion Hematologic/Lymphatic: bleeding problems, blood clots or swollen lymph nodes Endocrine: temperature intolerance or unexpected weight changes Breast: new or changing breast lumps or nipple discharge Resp: cough, shortness of breath, or wheezing CV: chest pain or dyspnea on exertion GI: as per HPI GU: dysuria, trouble voiding, or hematuria MSK: joint pain or joint stiffness Neuro: TIA or stroke symptoms Derm: pruritus and skin lesion changes Psych: anxiety and depression  PE Blood pressure 128/68, pulse (!) 57, temperature 98.7 F (37.1 C), temperature source Oral, resp. rate 18, last menstrual period 05/14/2019, SpO2 97 %. Constitutional: NAD; conversant; no deformities Eyes: Moist  conjunctiva; no lid lag; anicteric; PERRL Neck: Trachea midline; no thyromegaly Lungs: Normal respiratory effort; no tactile fremitus CV: RRR; no palpable thrills; no pitting edema GI: Abd soft, exquisitely ttp in RLQ with voluntary guarding; +Rovsing's; nondistended; no palpable hepatosplenomegaly MSK: Normal range of motion of extremities; no clubbing/cyanosis Psychiatric: Appropriate affect; alert and oriented x3 Lymphatic: No palpable cervical or axillary lymphadenopathy  Results for orders placed or performed during the hospital encounter of 03/24/22 (from the past 48 hour(s))  Pregnancy, urine     Status: None   Collection Time: 03/24/22 12:20 PM  Result Value Ref Range   Preg Test, Ur NEGATIVE NEGATIVE    Comment:        THE SENSITIVITY OF THIS METHODOLOGY IS >20 mIU/mL. Performed at [facility], [address], [place], [place] [address]   Lipase, blood     Status: None   Collection Time:  03/24/22 12:21 PM  Result Value Ref Range   Lipase 31 11 - 51 U/L    Comment: Performed at [facility], [address] [place], [place] [address]  Comprehensive metabolic panel     Status: Abnormal   Collection Time: 03/24/22 12:21 PM  Result Value Ref Range   Sodium 138 135 - 145 mmol/L   Potassium 3.6 3.5 - 5.1 mmol/L   Chloride 104 98 - 111 mmol/L   CO2 22 22 - 32 mmol/L   Glucose, Bld 116 (H) 70 - 99 mg/dL    Comment: Glucose reference range applies only to samples taken after fasting for at least 8 hours.   BUN 15 6 - 20 mg/dL   Creatinine, Ser 2.84 0.44 - 1.00 mg/dL   Calcium 9.8 8.9 - 13.2 mg/dL   Total Protein 7.6 6.5 - 8.1 g/dL   Albumin 4.8 3.5 - 5.0 g/dL   AST 15 15 - 41 U/L   ALT 15 0 - 44 U/L   Alkaline Phosphatase 40 38 - 126 U/L   Total Bilirubin 2.0 (H) 0.3 - 1.2 mg/dL   GFR, Estimated >44 >01 mL/min    Comment: (NOTE) Calculated using the CKD-EPI Creatinine Equation (2021)    Anion gap 12 5 - 15    Comment: Performed at [facility], [address], [place], [place] [address]  CBC     Status: Abnormal   Collection Time: 03/24/22 12:21 PM  Result Value Ref Range   WBC 13.2 (H) 4.0 - 10.5 K/uL   RBC 4.71 3.87 - 5.11 MIL/uL   Hemoglobin 14.5 12.0 - 15.0 g/dL   HCT 36.6 44.0 - 34.7 %   MCV 89.8 80.0 - 100.0 fL   MCH 30.8 26.0 - 34.0 pg   MCHC 34.3 30.0 - 36.0 g/dL   RDW 42.5 95.6 - 38.7 %   Platelets 275 150 - 400 K/uL   nRBC 0.0 0.0 - 0.2 %    Comment: Performed at [facility], [address], [place], [place] [address]  Urinalysis, Routine w reflex microscopic Urine, Clean Catch     Status: Abnormal   Collection Time: 03/24/22 12:21 PM  Result Value Ref Range   Color, Urine YELLOW YELLOW   APPearance CLEAR CLEAR   Specific Gravity, Urine 1.034 (H) 1.005 - 1.030   pH 5.5 5.0 - 8.0   Glucose, UA NEGATIVE NEGATIVE mg/dL   Hgb urine dipstick NEGATIVE NEGATIVE   Bilirubin Urine NEGATIVE  NEGATIVE   Ketones, ur 15 (A) NEGATIVE mg/dL   Protein, ur TRACE (A) NEGATIVE  mg/dL   Nitrite NEGATIVE NEGATIVE   Leukocytes,Ua TRACE (A) NEGATIVE   RBC / HPF 0-5 0 - 5 RBC/hpf   WBC, UA 0-5 0 - 5 WBC/hpf   Squamous Epithelial / LPF 0-5 0 - 5   Mucus PRESENT     Comment: Performed at [facility], [address], [place], [place] [address]    CT ABDOMEN PELVIS W CONTRAST  Result Date: 03/24/2022 CLINICAL DATA:  Right lower quadrant abdominal pain EXAM: CT ABDOMEN AND PELVIS WITH CONTRAST TECHNIQUE: Multidetector CT imaging of the abdomen and pelvis was performed using the standard protocol following bolus administration of intravenous contrast. RADIATION DOSE REDUCTION: This exam was performed according to the departmental dose-optimization program which includes automated exposure control, adjustment of the mA and/or kV according to patient size and/or use of iterative reconstruction technique. CONTRAST:  32mL OMNIPAQUE IOHEXOL 300 MG/ML  SOLN COMPARISON:  None Available. FINDINGS: Lower chest: No acute abnormality. Hepatobiliary: Small low-attenuation lesion of the right hepatic dome which is likely a simple hepatic cysts. No suspicious liver lesions. Gallbladder is unremarkable. No biliary ductal dilation. Pancreas: Unremarkable. No pancreatic ductal dilatation or surrounding inflammatory changes. Spleen: Normal in size without focal abnormality. Adrenals/Urinary Tract: Adrenal glands are unremarkable. No hydronephrosis or nephrolithiasis. Low-attenuation lesions of the right kidney which are too small to completely characterize but likely simple cysts, no further follow-up imaging is needed. Bladder is unremarkable. Stomach/Bowel: Dilated appendix, measuring up to 14 mm, with wall thickening and surrounding inflammatory change. No evidence of extraluminal air or fluid collection. Normal appearing stomach. Small and large bowel with no wall thickening, inflammatory change or  evidence of obstruction. Vascular/Lymphatic: No significant vascular findings are present. No enlarged abdominal or pelvic lymph nodes. Reproductive: Uterus and bilateral adnexa are unremarkable. Other: Bilateral breast implants.  No free intraperitoneal fluid. Musculoskeletal: No acute or significant osseous findings. IMPRESSION: Findings compatible with acute uncomplicated appendicitis. Electronically Signed   By: Yetta Glassman M.D.   On: 03/24/2022 16:24    I have personally reviewed the relevant CT A/P 03/24/22, CBC, CMP  A/P: Naailah Mineo is an 51 y.o. female with acute appendicitis, no evidence of perforation or abscess  -The anatomy and physiology of the GI tract was discussed at length with her. The pathophysiology of appendicitis was discussed as well. -We reviewed options moving forward for treatment, covering IV abx vs surgery. We discussed that with antibiotics alone, there is reasonable success in managing appendicitis, however, risks of recurrence at 44yrs being as high as 40% in some studies; additionally with small fecaliths, risk of recurrence or abx failure potentially higher. We discussed appendectomy - laparoscopic and potential open techniques as well as scenarios where an ileocecectomy could be necessary. We discussed the material risks (including, but not limited to, pain, bleeding, infection, scarring, need for blood transfusion, damage to surrounding structures- blood vessels/nerves/viscus/organs, damage to ureter/bladder, urine leak, leak from staple line, need for additional procedures, hernia, recurrence although quite low, pneumonia, heart attack, stroke, death) benefits and alternatives to surgery were discussed. The patient's questions were answered to her satisfaction, she voiced understanding and elected to proceed with surgery. Additionally, we discussed typical postoperative expectations and the recovery process.  Nadeen Landau, [facility], [place]

## 2022-03-25 ENCOUNTER — Other Ambulatory Visit (HOSPITAL_BASED_OUTPATIENT_CLINIC_OR_DEPARTMENT_OTHER): Payer: Self-pay

## 2022-03-25 ENCOUNTER — Encounter (HOSPITAL_COMMUNITY): Payer: Self-pay | Admitting: Surgery

## 2022-03-25 LAB — CBC WITH DIFFERENTIAL/PLATELET
Abs Immature Granulocytes: 0.05 10*3/uL (ref 0.00–0.07)
Basophils Absolute: 0 10*3/uL (ref 0.0–0.1)
Basophils Relative: 0 %
Eosinophils Absolute: 0 10*3/uL (ref 0.0–0.5)
Eosinophils Relative: 0 %
HCT: 39.7 % (ref 36.0–46.0)
Hemoglobin: 13.5 g/dL (ref 12.0–15.0)
Immature Granulocytes: 0 %
Lymphocytes Relative: 7 %
Lymphs Abs: 0.8 10*3/uL (ref 0.7–4.0)
MCH: 30.5 pg (ref 26.0–34.0)
MCHC: 34 g/dL (ref 30.0–36.0)
MCV: 89.8 fL (ref 80.0–100.0)
Monocytes Absolute: 0.5 10*3/uL (ref 0.1–1.0)
Monocytes Relative: 4 %
Neutro Abs: 11 10*3/uL — ABNORMAL HIGH (ref 1.7–7.7)
Neutrophils Relative %: 89 %
Platelets: 228 10*3/uL (ref 150–400)
RBC: 4.42 MIL/uL (ref 3.87–5.11)
RDW: 13 % (ref 11.5–15.5)
WBC: 12.4 10*3/uL — ABNORMAL HIGH (ref 4.0–10.5)
nRBC: 0 % (ref 0.0–0.2)

## 2022-03-25 LAB — HIV ANTIBODY (ROUTINE TESTING W REFLEX): HIV Screen 4th Generation wRfx: NONREACTIVE

## 2022-03-25 MED ORDER — TRAMADOL HCL 50 MG PO TABS
50.0000 mg | ORAL_TABLET | Freq: Four times a day (QID) | ORAL | 0 refills | Status: DC | PRN
Start: 2022-03-25 — End: 2022-03-25
  Filled 2022-03-25: qty 15, 4d supply, fill #0

## 2022-03-25 MED ORDER — IBUPROFEN 600 MG PO TABS: 600.0000 mg | ORAL_TABLET | Freq: Four times a day (QID) | ORAL | 0 refills | Status: AC | PRN

## 2022-03-25 MED ORDER — ACETAMINOPHEN 500 MG PO TABS: 1000.0000 mg | ORAL_TABLET | Freq: Three times a day (TID) | ORAL | 0 refills | Status: AC | PRN

## 2022-03-25 MED ORDER — TRAMADOL HCL 50 MG PO TABS: 50.0000 mg | ORAL_TABLET | Freq: Four times a day (QID) | ORAL | 0 refills | Status: AC | PRN

## 2022-03-25 MED ORDER — IBUPROFEN 600 MG PO TABS: 600.0000 mg | ORAL_TABLET | Freq: Four times a day (QID) | ORAL | 0 refills | Status: DC | PRN

## 2022-03-25 NOTE — Discharge Summary (Signed)
Physician Discharge Summary   Patient ID: Charlotte Morris [id number] 50 y.o. Aug 17, 1971  Admit date: 03/24/2022  Discharge date and time: 03/25/2022 10:55 AM   Admitting Physician: Marin Olp, MD  Discharge Physician: Sophronia Simas, MD  Admission Diagnoses: Acute appendicitis [K35.80] Acute appendicitis with localized peritonitis, without perforation, abscess, or gangrene [K35.30]  Discharge Diagnoses: Acute appendicitis  Admission Condition: stable  Discharged Condition: good  Indication for Admission: [name] is a 51 yo female who presented to the ED with one day of acute RLQ abdominal pain. A CT scan showed acute appendicitis without perforation. General surgery was consulted and she was admitted for further management.  Hospital Course: The patient was taken to the OR the evening of 03/24/22 for a laparoscopic appendectomy. Please see separately dictated operative note for further details of this procedure. Postoperatively she was admitted to the med-surg floor in stable condition. She was advanced to a regular diet, which she tolerated. On the morning after surgery, she afebrile and hemodynamically stable. Her pain was controlled on oral medications and she was tolerating oral intake. She was examined and deemed appropriate for discharge home.  Consults: None  Significant Diagnostic Studies: radiology: Findings compatible with acute uncomplicated appendicitis.  Treatments: antibiotics: ceftriaxone and metronidazole and surgery: Laparoscopic appendectomy  Discharge Exam: General: resting comfortably, NAD Neuro: alert and oriented, no focal deficits Resp: normal work of breathing on room air Abdomen: soft, nondistended, nontender to palpation. Incisions clean and dry with no erythema or induration. Extremities: warm and well-perfused   Disposition: Discharge disposition: 01-Home or Self Care       Patient Instructions:  Allergies as of 03/25/2022   No  Known Allergies      Medication List     TAKE these medications    acetaminophen 500 MG tablet Commonly known as: TYLENOL Take 2 tablets (1,000 mg total) by mouth every 8 (eight) hours as needed for mild pain.   bismuth subsalicylate 262 MG/15ML suspension Commonly known as: PEPTO BISMOL Take 30 mLs by mouth 2 (two) times daily as needed (stomach pain).   ibuprofen 600 MG tablet Commonly known as: ADVIL Take 1 tablet (600 mg total) by mouth every 6 (six) hours as needed for mild pain.   traMADol 50 MG tablet Commonly known as: ULTRAM Take 1 tablet (50 mg total) by mouth every 6 (six) hours as needed for up to 5 days for severe pain.       Activity: no driving while on analgesics and no heavy lifting for 4 weeks Diet: regular diet Wound Care: keep wound clean and dry  Follow-up in 4 weeks.  Signed: Fritzi Mandes 03/25/2022 4:23 PM

## 2022-03-25 NOTE — Progress Notes (Signed)
Nsg Discharge Note  Admit Date:  03/24/2022 Discharge date: 03/25/2022   Bary Leriche to be D/C'd Home per MD order.  AVS completed. Patient/caregiver able to verbalize understanding.  Discharge Medication: Allergies as of 03/25/2022   No Known Allergies      Medication List     TAKE these medications    acetaminophen 500 MG tablet Commonly known as: TYLENOL Take 2 tablets (1,000 mg total) by mouth every 8 (eight) hours as needed for mild pain.   bismuth subsalicylate 262 MG/15ML suspension Commonly known as: PEPTO BISMOL Take 30 mLs by mouth 2 (two) times daily as needed (stomach pain).   ibuprofen 600 MG tablet Commonly known as: ADVIL Take 1 tablet (600 mg total) by mouth every 6 (six) hours as needed for mild pain.   traMADol 50 MG tablet Commonly known as: ULTRAM Take 1 tablet (50 mg total) by mouth every 6 (six) hours as needed for up to 5 days for severe pain.        Discharge Assessment: Vitals:   03/25/22 0519 03/25/22 0730  BP: 132/79   Pulse: 62   Resp: 17   Temp: 98.3 F (36.8 C)   SpO2: 94% 95%   Skin clean, dry and intact without evidence of skin break down, no evidence of skin tears noted. IV catheter discontinued intact. Site without signs and symptoms of complications - no redness or edema noted at insertion site, patient denies c/o pain - only slight tenderness at site.  Dressing with slight pressure applied.  D/c Instructions-Education: Discharge instructions given to patient/family with verbalized understanding. D/c education completed with patient/family including follow up instructions, medication list, d/c activities limitations if indicated, with other d/c instructions as indicated by MD - patient able to verbalize understanding, all questions fully answered. Patient instructed to return to ED, call 911, or call MD for any changes in condition.  Patient escorted via WC, and D/C home via private auto.  Kizzie Bane, RN 03/25/2022  10:43 AM

## 2022-03-25 NOTE — Discharge Instructions (Signed)
[facility] DISCHARGE INSTRUCTIONS  Activity No heavy lifting greater than 15 pounds for 4 weeks after surgery. Ok to shower in 24 hours, but do not bathe or submerge incisions underwater. Do not drive while taking narcotic pain medication.  Wound Care Your incisions are covered with skin glue called Dermabond. This will peel off on its own over time. You may shower and allow warm soapy water to run over your incisions. Gently pat dry. Do not submerge your incision underwater. Monitor your incision for any new redness, tenderness, or drainage.  When to Call us: Fever greater than 100.5 New redness, drainage, or swelling at incision site Severe pain, nausea, or vomiting  Follow-up You will have a follow up appointment scheduled in about 1 month - our office will call you to schedule this, or you may call the number below. This will be at the [facility] office at [address], [address], [place], [place]. Please arrive at least 15 minutes prior to your scheduled appointment time.  For questions or concerns, please call the office at [phone].

## 2022-03-26 LAB — SURGICAL PATHOLOGY

## 2022-08-11 ENCOUNTER — Other Ambulatory Visit (HOSPITAL_COMMUNITY): Payer: Self-pay

## 2022-08-12 ENCOUNTER — Other Ambulatory Visit (HOSPITAL_COMMUNITY): Payer: Self-pay

## 2022-08-12 MED ORDER — MOUNJARO 2.5 MG/0.5ML ~~LOC~~ SOAJ
2.5000 mg | SUBCUTANEOUS | 0 refills | Status: AC
  Filled 2022-08-12 (×2): qty 2, 28d supply, fill #0

## 2022-08-18 ENCOUNTER — Other Ambulatory Visit (HOSPITAL_COMMUNITY): Payer: Self-pay

## 2023-01-13 ENCOUNTER — Other Ambulatory Visit (HOSPITAL_COMMUNITY): Payer: Self-pay

## 2023-01-31 ENCOUNTER — Other Ambulatory Visit (HOSPITAL_COMMUNITY): Payer: Self-pay

## 2023-02-01 ENCOUNTER — Other Ambulatory Visit (HOSPITAL_COMMUNITY): Payer: Self-pay

## 2023-02-01 MED ORDER — ZEPBOUND 15 MG/0.5ML ~~LOC~~ SOAJ
15.0000 mg | SUBCUTANEOUS | 0 refills | Status: DC
  Filled 2023-02-01: qty 2, 28d supply, fill #0

## 2023-02-22 ENCOUNTER — Other Ambulatory Visit (HOSPITAL_COMMUNITY): Payer: Self-pay

## 2023-02-28 ENCOUNTER — Other Ambulatory Visit (HOSPITAL_COMMUNITY): Payer: Self-pay

## 2023-03-01 ENCOUNTER — Other Ambulatory Visit (HOSPITAL_COMMUNITY): Payer: Self-pay

## 2023-03-02 ENCOUNTER — Other Ambulatory Visit (HOSPITAL_COMMUNITY): Payer: Self-pay

## 2023-03-03 ENCOUNTER — Other Ambulatory Visit (HOSPITAL_COMMUNITY): Payer: Self-pay

## 2023-03-04 ENCOUNTER — Other Ambulatory Visit (HOSPITAL_COMMUNITY): Payer: Self-pay

## 2023-06-14 IMAGING — MG MM  DIGITAL DIAGNOSTIC BREAST BILAT IMPLANT W/ TOMO W/ CAD
8 of 19 series · 8 of 40 positions shown · non-contrast
Comparison: Previous exam(s).

CLINICAL DATA: Follow-up for probably benign calcifications in the
RIGHT breast. These probably benign calcifications were initially
identified on screening mammogram dated 05/17/2019.

EXAM:
DIGITAL DIAGNOSTIC BILATERAL MAMMOGRAM WITH IMPLANTS, CAD AND
TOMOSYNTHESIS
TECHNIQUE: Bilateral digital diagnostic mammography and breast tomosynthesis
was performed. The images were evaluated with computer-aided
detection. Standard and/or implant displaced views were performed.

[R XCCL]
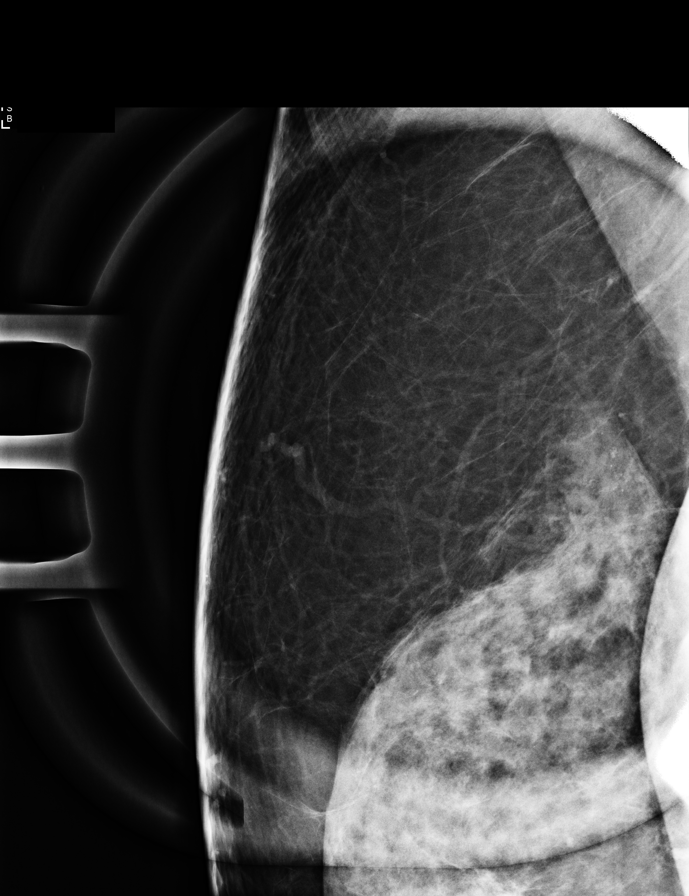

[R CC (1 of 3)]
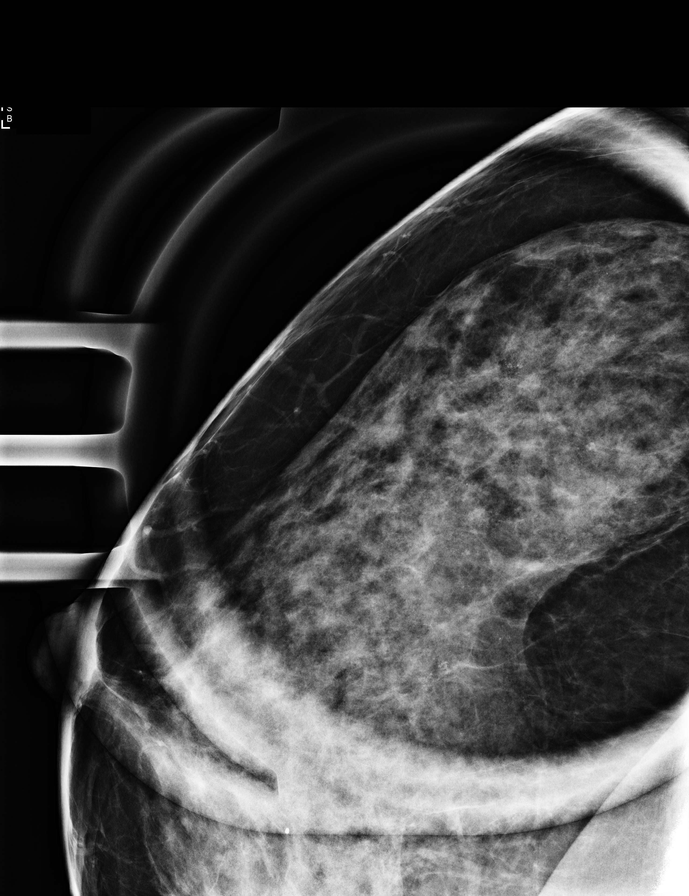

[R CC (2 of 3)]
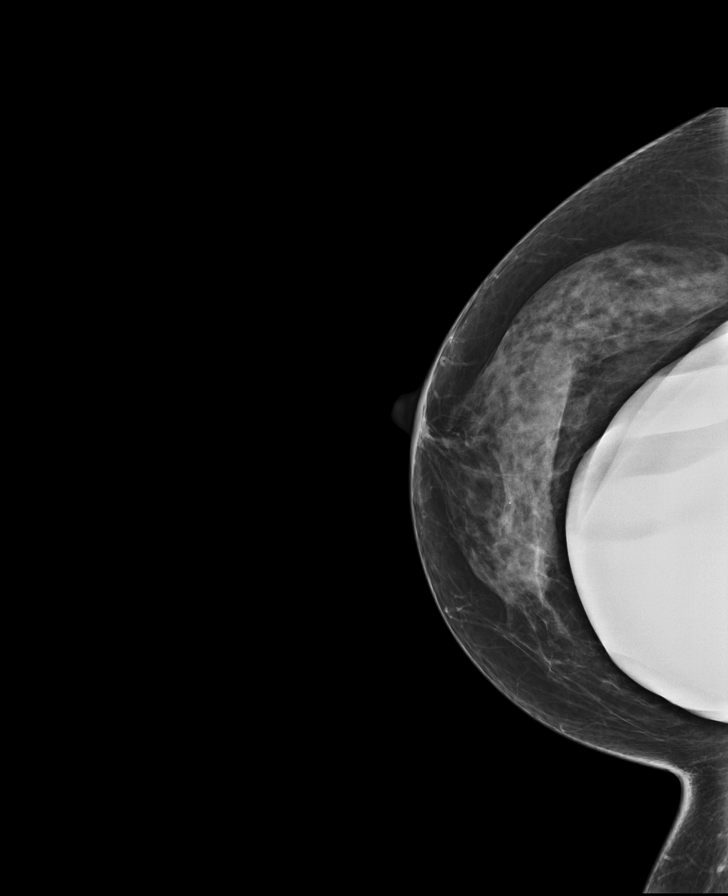

[L CC]
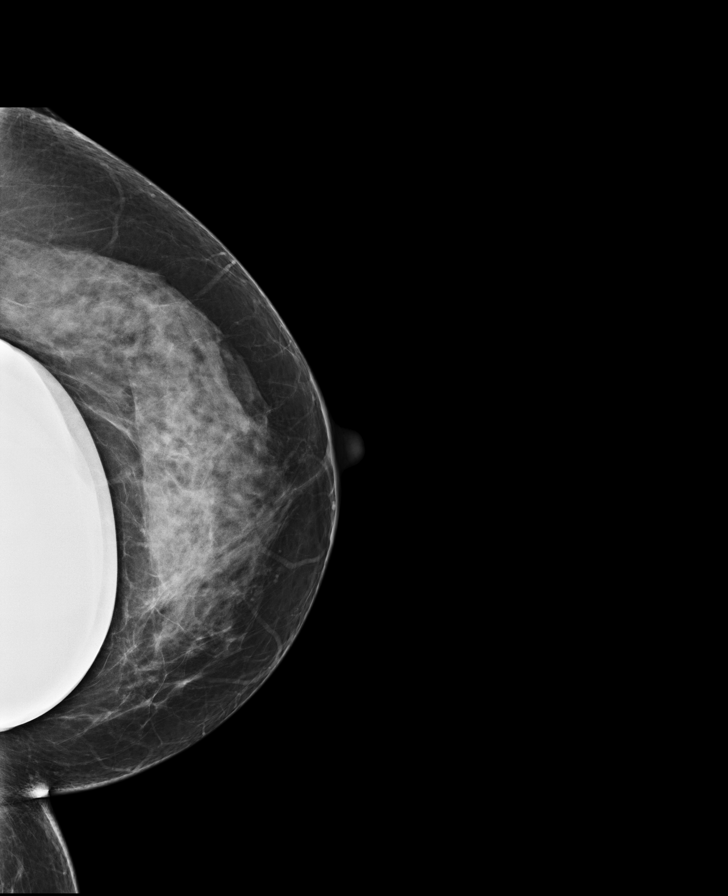

[R MLO]
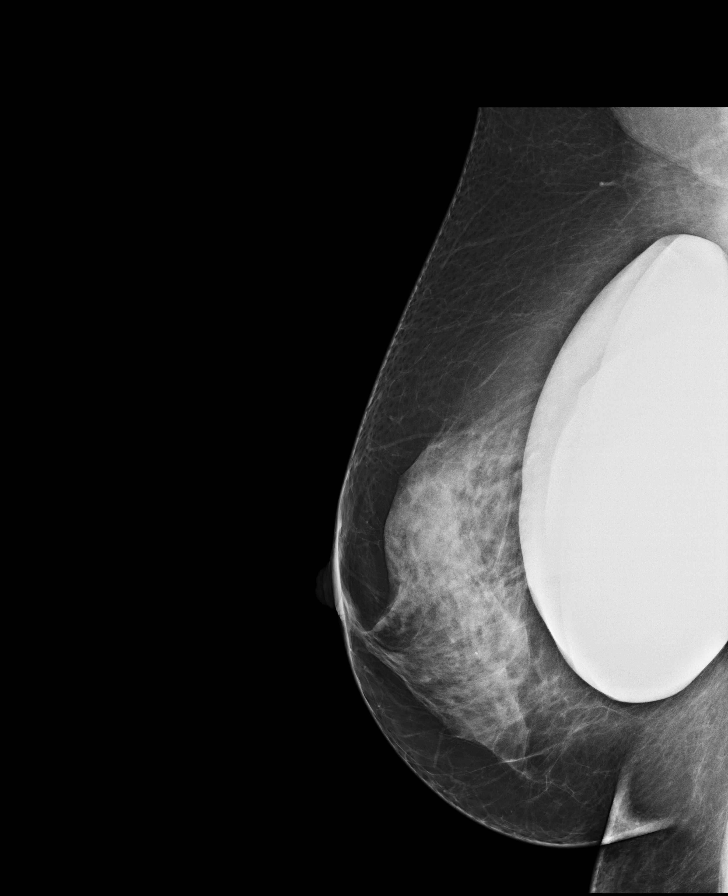

[R CC (3 of 3)]
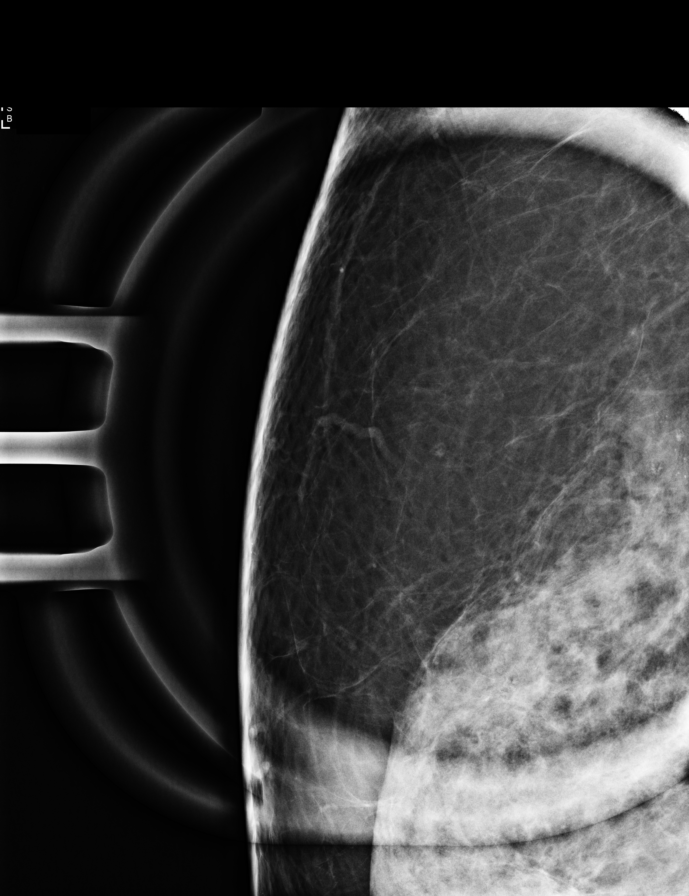

[L MLO]
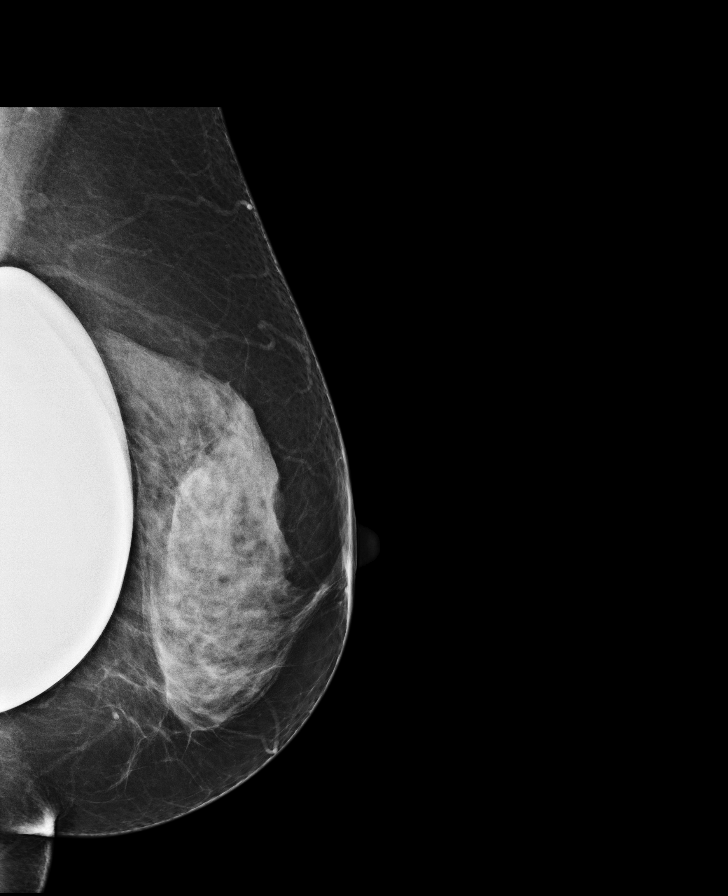

[R MLO synth-2D]
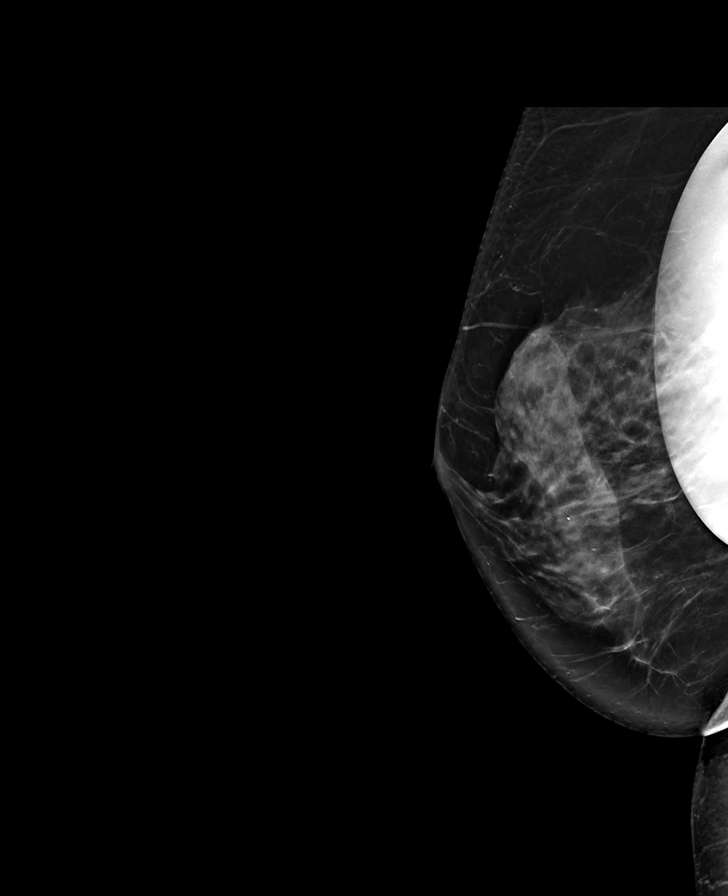

[8 of 40 positions shown; findings below may reference images not displayed]

ACR Breast Density Category d: The breast tissue is extremely dense,
which lowers the sensitivity of mammography.
FINDINGS: The RIGHT breast calcifications are stable. There are no new
dominant masses, suspicious calcifications or secondary signs of
malignancy within either breast. The patient has prepectoral
implants.
IMPRESSION: No evidence of malignancy within either breast. Stable benign
calcifications within the RIGHT breast, now shown stable for greater
than 2 years confirming benignity.

Patient may return to routine annual bilateral screening mammogram
schedule.

RECOMMENDATION:
Screening mammogram in one year.(Code:[id number])

I have discussed the findings and recommendations with the patient.
If applicable, a reminder letter will be sent to the patient
regarding the next appointment.

BI-RADS CATEGORY  2: Benign.

## 2024-07-04 ENCOUNTER — Other Ambulatory Visit: Payer: Self-pay | Admitting: Obstetrics and Gynecology

## 2024-07-04 DIAGNOSIS — Z1231 Encounter for screening mammogram for malignant neoplasm of breast: Secondary | ICD-10-CM

## 2024-09-17 ENCOUNTER — Ambulatory Visit
Admission: RE | Admit: 2024-09-17 | Discharge: 2024-09-17 | Disposition: A | Discharge status: Home / Self Care | Source: Ambulatory Visit | Attending: Obstetrics and Gynecology | Admitting: Obstetrics and Gynecology

## 2024-09-17 DIAGNOSIS — Z1231 Encounter for screening mammogram for malignant neoplasm of breast: Secondary | ICD-10-CM
